# Patient Record
Sex: Male | Born: 1960 | Race: White | Hispanic: No | Smoking: Current every day smoker
Health system: Southern US, Community
[De-identification: ages and names within clinical notes are randomized; demographics above are authoritative.]

## PROBLEM LIST (undated history)

## (undated) HISTORY — PX: REPLACEMENT TOTAL KNEE: SUR1224

## (undated) HISTORY — PX: LEG SURGERY: SHX1003

---

## 2019-12-06 ENCOUNTER — Emergency Department
Admission: EM | Admit: 2019-12-06 | Discharge: 2019-12-06 | Disposition: A | Discharge status: Home / Self Care | Payer: No Typology Code available for payment source | Attending: Emergency Medicine | Admitting: Emergency Medicine

## 2019-12-06 ENCOUNTER — Encounter: Payer: Self-pay | Admitting: *Deleted

## 2019-12-06 ENCOUNTER — Emergency Department: Payer: No Typology Code available for payment source

## 2019-12-06 ENCOUNTER — Other Ambulatory Visit: Payer: Self-pay

## 2019-12-06 DIAGNOSIS — S62663A Nondisplaced fracture of distal phalanx of left middle finger, initial encounter for closed fracture: Secondary | ICD-10-CM | POA: Diagnosis not present

## 2019-12-06 DIAGNOSIS — S62663B Nondisplaced fracture of distal phalanx of left middle finger, initial encounter for open fracture: Secondary | ICD-10-CM

## 2019-12-06 DIAGNOSIS — F17210 Nicotine dependence, cigarettes, uncomplicated: Secondary | ICD-10-CM | POA: Diagnosis not present

## 2019-12-06 DIAGNOSIS — Y998 Other external cause status: Secondary | ICD-10-CM | POA: Insufficient documentation

## 2019-12-06 DIAGNOSIS — S6992XA Unspecified injury of left wrist, hand and finger(s), initial encounter: Secondary | ICD-10-CM

## 2019-12-06 DIAGNOSIS — Y9389 Activity, other specified: Secondary | ICD-10-CM | POA: Insufficient documentation

## 2019-12-06 DIAGNOSIS — S0990XA Unspecified injury of head, initial encounter: Secondary | ICD-10-CM | POA: Diagnosis present

## 2019-12-06 DIAGNOSIS — Y9289 Other specified places as the place of occurrence of the external cause: Secondary | ICD-10-CM | POA: Insufficient documentation

## 2019-12-06 MED ORDER — CYCLOBENZAPRINE HCL 5 MG PO TABS: ORAL_TABLET | ORAL | 0 refills | Status: AC

## 2019-12-06 MED ORDER — CEPHALEXIN 500 MG PO CAPS: 500.0000 mg | ORAL_CAPSULE | Freq: Four times a day (QID) | ORAL | 0 refills | Status: AC

## 2019-12-06 NOTE — Discharge Instructions (Signed)
You have a fracture to your left middle finger. Please keep left middle finger clean and covered and wear finger splint. Please take antibiotics to prevent infection. Call primary care and hand orthopedics tomorrow for follow-up appointments. You can take Flexeril to help relax your muscles. Do not drive or work while taking Flexeril. Return to the emergency department for any concerns.
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
